# Patient Record
Sex: Female | Born: 1970 | Race: White | Hispanic: No | Marital: Single | Smoking: Never smoker
Health system: Southern US, Community
[De-identification: ages and names within clinical notes are randomized; demographics above are authoritative.]

## PROBLEM LIST (undated history)

## (undated) DIAGNOSIS — K567 Ileus, unspecified: Secondary | ICD-10-CM

## (undated) DIAGNOSIS — K5792 Diverticulitis of intestine, part unspecified, without perforation or abscess without bleeding: Secondary | ICD-10-CM

## (undated) HISTORY — PX: ABDOMINAL HYSTERECTOMY: SHX81

---

## 2004-10-18 ENCOUNTER — Ambulatory Visit: Payer: Self-pay

## 2004-10-28 ENCOUNTER — Ambulatory Visit: Payer: Self-pay

## 2005-09-15 ENCOUNTER — Ambulatory Visit: Payer: Self-pay | Admitting: Internal Medicine

## 2006-11-02 ENCOUNTER — Ambulatory Visit: Payer: Self-pay | Admitting: Family Medicine

## 2008-05-13 ENCOUNTER — Ambulatory Visit: Payer: Self-pay | Admitting: Physician Assistant

## 2008-05-14 ENCOUNTER — Emergency Department: Payer: Self-pay | Admitting: Emergency Medicine

## 2008-06-30 ENCOUNTER — Ambulatory Visit: Payer: Self-pay | Admitting: Internal Medicine

## 2008-07-02 ENCOUNTER — Ambulatory Visit: Payer: Self-pay | Admitting: Internal Medicine

## 2009-03-30 ENCOUNTER — Ambulatory Visit: Payer: Self-pay

## 2009-04-29 ENCOUNTER — Inpatient Hospital Stay: Payer: Self-pay | Admitting: Obstetrics and Gynecology

## 2009-06-14 ENCOUNTER — Ambulatory Visit: Payer: Self-pay | Admitting: Internal Medicine

## 2011-06-07 ENCOUNTER — Ambulatory Visit: Payer: Self-pay | Admitting: Internal Medicine

## 2011-08-30 ENCOUNTER — Ambulatory Visit: Payer: Self-pay | Admitting: Internal Medicine

## 2015-08-30 ENCOUNTER — Other Ambulatory Visit: Payer: Self-pay | Admitting: Internal Medicine

## 2015-08-30 DIAGNOSIS — N644 Mastodynia: Secondary | ICD-10-CM

## 2015-09-10 ENCOUNTER — Other Ambulatory Visit: Payer: Self-pay

## 2015-09-10 ENCOUNTER — Ambulatory Visit: Payer: Self-pay | Attending: Internal Medicine

## 2015-10-26 ENCOUNTER — Ambulatory Visit
Admission: RE | Admit: 2015-10-26 | Discharge: 2015-10-26 | Disposition: A | Discharge status: Home / Self Care | Payer: BC Managed Care – PPO | Source: Ambulatory Visit | Attending: Internal Medicine | Admitting: Internal Medicine

## 2015-10-26 DIAGNOSIS — N644 Mastodynia: Secondary | ICD-10-CM

## 2016-01-09 ENCOUNTER — Encounter: Payer: Self-pay | Admitting: *Deleted

## 2016-01-09 ENCOUNTER — Emergency Department
Admission: EM | Admit: 2016-01-09 | Discharge: 2016-01-09 | Disposition: A | Discharge status: Home / Self Care | Payer: BC Managed Care – PPO | Attending: Emergency Medicine | Admitting: Emergency Medicine

## 2016-01-09 ENCOUNTER — Emergency Department: Payer: BC Managed Care – PPO

## 2016-01-09 DIAGNOSIS — R1032 Left lower quadrant pain: Secondary | ICD-10-CM | POA: Diagnosis present

## 2016-01-09 DIAGNOSIS — Z90710 Acquired absence of both cervix and uterus: Secondary | ICD-10-CM | POA: Diagnosis not present

## 2016-01-09 DIAGNOSIS — K567 Ileus, unspecified: Secondary | ICD-10-CM | POA: Diagnosis not present

## 2016-01-09 DIAGNOSIS — K5732 Diverticulitis of large intestine without perforation or abscess without bleeding: Secondary | ICD-10-CM | POA: Insufficient documentation

## 2016-01-09 HISTORY — DX: Ileus, unspecified: K56.7

## 2016-01-09 HISTORY — DX: Diverticulitis of intestine, part unspecified, without perforation or abscess without bleeding: K57.92

## 2016-01-09 LAB — COMPREHENSIVE METABOLIC PANEL
ALBUMIN: 4.3 g/dL (ref 3.5–5.0)
ALT: 18 U/L (ref 14–54)
AST: 19 U/L (ref 15–41)
Alkaline Phosphatase: 55 U/L (ref 38–126)
Anion gap: 6 (ref 5–15)
BUN: 11 mg/dL (ref 6–20)
CHLORIDE: 102 mmol/L (ref 101–111)
CO2: 26 mmol/L (ref 22–32)
CREATININE: 0.67 mg/dL (ref 0.44–1.00)
Calcium: 9 mg/dL (ref 8.9–10.3)
GFR calc Af Amer: >60 mL/min (ref >60)
Glucose, Bld: 94 mg/dL (ref 65–99)
POTASSIUM: 3.7 mmol/L (ref 3.5–5.1)
SODIUM: 134 mmol/L — AB (ref 135–145)
Total Bilirubin: 1 mg/dL (ref 0.3–1.2)
Total Protein: 7.6 g/dL (ref 6.5–8.1)

## 2016-01-09 LAB — CBC
HEMATOCRIT: 38.8 % (ref 35.0–47.0)
Hemoglobin: 13 g/dL (ref 12.0–16.0)
MCH: 27.2 pg (ref 26.0–34.0)
MCHC: 33.6 g/dL (ref 32.0–36.0)
MCV: 80.9 fL (ref 80.0–100.0)
PLATELETS: 252 10*3/uL (ref 150–440)
RBC: 4.8 MIL/uL (ref 3.80–5.20)
RDW: 13.6 % (ref 11.5–14.5)
WBC: 11.2 10*3/uL — AB (ref 3.6–11.0)

## 2016-01-09 LAB — URINALYSIS COMPLETE WITH MICROSCOPIC (ARMC ONLY)
BACTERIA UA: NONE SEEN
Bilirubin Urine: NEGATIVE
Glucose, UA: NEGATIVE mg/dL
LEUKOCYTES UA: NEGATIVE
Nitrite: NEGATIVE
PH: 5 (ref 5.0–8.0)
Protein, ur: NEGATIVE mg/dL
Specific Gravity, Urine: 1.004 — ABNORMAL LOW (ref 1.005–1.030)
WBC, UA: NONE SEEN WBC/hpf (ref 0–5)

## 2016-01-09 LAB — LIPASE, BLOOD: LIPASE: 17 U/L (ref 11–51)

## 2016-01-09 MED ORDER — METRONIDAZOLE 500 MG PO TABS
500.0000 mg | ORAL_TABLET | Freq: Two times a day (BID) | ORAL | Status: AC
Start: 2016-01-09 — End: 2016-01-30

## 2016-01-09 MED ORDER — IOHEXOL 240 MG/ML SOLN
25.0000 mL | Freq: Once | INTRAMUSCULAR | Status: AC | PRN
  Administered 2016-01-09: 25 mL via ORAL
  Filled 2016-01-09: qty 25

## 2016-01-09 MED ORDER — CIPROFLOXACIN HCL 500 MG PO TABS: 500.0000 mg | ORAL_TABLET | Freq: Two times a day (BID) | ORAL | Status: AC

## 2016-01-09 MED ORDER — IOHEXOL 300 MG/ML  SOLN
100.0000 mL | Freq: Once | INTRAMUSCULAR | Status: DC | PRN
  Filled 2016-01-09: qty 100

## 2016-01-09 NOTE — ED Notes (Signed)
Pt finished with oral contrast, CT tech notified.

## 2016-01-09 NOTE — Discharge Instructions (Signed)
Diverticulitis Diverticulitis is when small pockets that have formed in your colon (large intestine) become infected or swollen. HOME CARE  Follow your doctor's instructions.  Follow a special diet if told by your doctor.  When you feel better, your doctor may tell you to change your diet. You may be told to eat a lot of fiber. Fruits and vegetables are good sources of fiber. Fiber makes it easier to poop (have bowel movements).  Take supplements or probiotics as told by your doctor.  Only take medicines as told by your doctor.  Keep all follow-up visits with your doctor. GET HELP IF:  Your pain does not get better.  You have a hard time eating food.  You are not pooping like normal. GET HELP RIGHT AWAY IF:  Your pain gets worse.  Your problems do not get better.  Your problems suddenly get worse.  You have a fever.  You keep throwing up (vomiting).  You have bloody or black, tarry poop (stool). MAKE SURE YOU:   Understand these instructions.  Will watch your condition.  Will get help right away if you are not doing well or get worse.   This information is not intended to replace advice given to you by your health care provider. Make sure you discuss any questions you have with your health care provider.   Document Released: 03/27/2008 Document Revised: 10/14/2013 Document Reviewed: 09/03/2013 Elsevier Interactive Patient Education [employer].  Please return immediately if condition worsens. Please contact her primary physician or the physician you were given for referral. If you have any specialist physicians involved in her treatment and plan please also contact them. Thank you for using [facility].

## 2016-01-09 NOTE — ED Notes (Addendum)
Patient c/o LLQ pain that radiates to the back. Patient denies vomiting or diarrhea. Patient has history of ileus and diverticulitis, but denies this seems like the same pain.

## 2016-01-09 NOTE — ED Notes (Signed)
Pt returned from Ct.

## 2016-01-09 NOTE — ED Provider Notes (Signed)
Time Seen: Approximately *1410 I have reviewed the triage notes  Chief Complaint: Abdominal Pain   History of Present Illness: Tanya Jimenez is a 45 y.o. female who is noticed a gradual onset of left lower quadrant abdominal pain that radiates to the left lower back region toward the left groin area. She's had a previous history of a partial hysterectomy. She's also had a previous history of diverticulitis. She did denies any dysuria, hematuria or urinary frequency. She states some mild bowel urgency with some loose stool. She denies any melena, hematochezia, fever, chills.   Past Medical History  Diagnosis Date  . Diverticulitis   . Ileus (Minor)     There are no active problems to display for this patient.   Past Surgical History  Procedure Laterality Date  . Abdominal hysterectomy      Past Surgical History  Procedure Laterality Date  . Abdominal hysterectomy      No current outpatient prescriptions on file.  Allergies:  Review of patient's allergies indicates no known allergies.  Family History: Family History  Problem Relation Age of Onset  . Uterine cancer Mother 58  . Breast cancer Paternal Aunt 39  . Stomach cancer Maternal Grandfather     Social History: Social History  Substance Use Topics  . Smoking status: Never Smoker   . Smokeless tobacco: None  . Alcohol Use: Yes     Comment: occasionally     Review of Systems:   10 point review of systems was performed and was otherwise negative:  Constitutional: No fever Eyes: No visual disturbances ENT: No sore throat, ear pain Cardiac: No chest pain Respiratory: No shortness of breath, wheezing, or stridor Abdomen: Left lower quadrant abdominal pain, no vomiting, No diarrhea Endocrine: No weight loss, No night sweats Extremities: No peripheral edema, cyanosis Skin: No rashes, easy bruising Neurologic: No focal weakness, trouble with speech or swollowing Urologic: No dysuria, Hematuria, or urinary  frequency   Physical Exam:  ED Triage Vitals  Enc Vitals Group     BP 01/09/16 1350 109/62 mmHg     Pulse Rate 01/09/16 1350 71     Resp 01/09/16 1350 18     Temp 01/09/16 1350 98.5 F (36.9 C)     Temp Source 01/09/16 1350 Oral     SpO2 01/09/16 1350 97 %     Weight 01/09/16 1350 126 lb (57.153 kg)     Height 01/09/16 1350 5\' 4"  (1.626 m)     Head Cir --      Peak Flow --      Pain Score 01/09/16 1351 7     Pain Loc --      Pain Edu? --      Excl. in [place]? --     General: Awake , Alert , and Oriented times 3; GCS 15 Head: Normal cephalic , atraumatic Eyes: Pupils equal , round, reactive to light Nose/Throat: No nasal drainage, patent upper airway without erythema or exudate.  Neck: Supple, Full range of motion, No anterior adenopathy or palpable thyroid masses Lungs: Clear to ascultation without wheezes , rhonchi, or rales Heart: Regular rate, regular rhythm without murmurs , gallops , or rubs Abdomen: Patient has tenderness toward the left lower quadrant without rebound, guarding , or rigidity; bowel sounds positive and symmetric in all 4 quadrants. No organomegaly .    Negative tenderness over McBurney's point, negative Murphy's sign  Extremities: 2 plus symmetric pulses. No edema, clubbing or cyanosis Neurologic: normal ambulation, Motor  symmetric without deficits, sensory intact Skin: warm, dry, no rashes   Labs:   All laboratory work was reviewed including any pertinent negatives or positives listed below:  Labs Reviewed  COMPREHENSIVE METABOLIC PANEL - Abnormal; Notable for the following:    Sodium 134 (*)    All other components within normal limits  CBC - Abnormal; Notable for the following:    WBC 11.2 (*)    All other components within normal limits  URINALYSIS COMPLETEWITH MICROSCOPIC ([facility] ONLY) - Abnormal; Notable for the following:    Color, Urine STRAW (*)    APPearance CLEAR (*)    Ketones, ur TRACE (*)    Specific Gravity, Urine 1.004 (*)    Hgb  urine dipstick 2+ (*)    Squamous Epithelial / LPF 0-5 (*)    All other components within normal limits  LIPASE, BLOOD   laboratory work shows a slightly elevated white blood cell count   Radiology:   EXAM: CT ABDOMEN AND PELVIS WITH CONTRAST  TECHNIQUE: Multidetector CT imaging of the abdomen and pelvis was performed using the standard protocol following bolus administration of intravenous contrast.  CONTRAST: 100 cc Omnipaque 300  COMPARISON: 06/14/2009  FINDINGS: Lower chest: Unremarkable  Hepatobiliary: Unremarkable  Pancreas: High likelihood of pancreas divisum.  Spleen: Unremarkable  Adrenals/Urinary Tract: Unremarkable  Stomach/Bowel: There is active diverticulitis at the junction of the descending and proximal sigmoid colon, images 52 through 61 series 2, with abnormal stranding in the mesentery adjacent to the inflamed diverticula. No abscess or extraluminal gas identified. The rectosigmoid junction is somewhat flattened leading to the unusual appearance on images 49 through 55 of series 2, but upon multiplanar reconstruction this bowel is thought to be normal.  Appendix normal.  Vascular/Lymphatic: Unremarkable  Reproductive: Uterus absent. Ovaries within normal limits for age.  Other: No supplemental non-categorized findings.  Musculoskeletal: Unremarkable  IMPRESSION: 1. Mild-to-moderate acute diverticulitis at the junction of the descending and proximal sigmoid colon. No abscess or extraluminal gas. 2. 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     I personally reviewed the radiologic studies     ED Course: * Differential diagnosis includes but is not exclusive to ovarian cyst, ovarian torsion, acute appendicitis, urinary tract infection, endometriosis, bowel obstruction, colitis, renal colic, gastroenteritis, etc. Given the patient's current clinical presentation and objective findings I felt most likely this was non-complicated  diverticulitis    Assessment: Diverticulitis     Plan: * Outpatient Patient was advised to return immediately if condition worsens. Patient was advised to follow up with their primary care physician or other specialized physicians involved in their outpatient care. The patient and/or family member/power of attorney had laboratory results reviewed at the bedside. All questions and concerns were addressed and appropriate discharge instructions were distributed by the nursing staff. Patient will be given prescriptions for Cipro and Flagyl           Daymon Larsen, MD 01/09/16 [phone]

## 2016-01-09 NOTE — ED Notes (Signed)
Patient transported to CT 

## 2016-01-09 NOTE — ED Notes (Signed)
Discussed discharge instructions, prescriptions, and follow-up care with patient. No questions or concerns at this time. Pt stable at discharge.  

## 2016-11-20 ENCOUNTER — Other Ambulatory Visit: Payer: Self-pay | Admitting: Internal Medicine

## 2016-11-20 DIAGNOSIS — Z1231 Encounter for screening mammogram for malignant neoplasm of breast: Secondary | ICD-10-CM

## 2016-12-26 ENCOUNTER — Ambulatory Visit
Admission: RE | Admit: 2016-12-26 | Discharge: 2016-12-26 | Disposition: A | Discharge status: Home / Self Care | Payer: BC Managed Care – PPO | Source: Ambulatory Visit | Attending: Internal Medicine | Admitting: Internal Medicine

## 2016-12-26 DIAGNOSIS — Z1231 Encounter for screening mammogram for malignant neoplasm of breast: Secondary | ICD-10-CM | POA: Diagnosis not present

## 2017-08-02 ENCOUNTER — Other Ambulatory Visit: Payer: Self-pay | Admitting: Physician Assistant

## 2017-08-02 DIAGNOSIS — N644 Mastodynia: Secondary | ICD-10-CM

## 2017-08-03 ENCOUNTER — Other Ambulatory Visit: Payer: Self-pay | Admitting: Physician Assistant

## 2017-08-03 DIAGNOSIS — N644 Mastodynia: Secondary | ICD-10-CM

## 2018-04-10 ENCOUNTER — Other Ambulatory Visit: Payer: Self-pay | Admitting: Internal Medicine

## 2018-04-10 DIAGNOSIS — Z1231 Encounter for screening mammogram for malignant neoplasm of breast: Secondary | ICD-10-CM

## 2018-04-15 ENCOUNTER — Other Ambulatory Visit: Payer: Self-pay | Admitting: Internal Medicine

## 2018-04-15 DIAGNOSIS — R140 Abdominal distension (gaseous): Secondary | ICD-10-CM

## 2018-04-22 ENCOUNTER — Ambulatory Visit
Admission: RE | Admit: 2018-04-22 | Discharge: 2018-04-22 | Disposition: A | Discharge status: Home / Self Care | Payer: BC Managed Care – PPO | Source: Ambulatory Visit | Attending: Internal Medicine | Admitting: Internal Medicine

## 2018-04-22 DIAGNOSIS — R140 Abdominal distension (gaseous): Secondary | ICD-10-CM | POA: Insufficient documentation

## 2018-04-22 MED ORDER — IOPAMIDOL (ISOVUE-300) INJECTION 61%
85.0000 mL | Freq: Once | INTRAVENOUS | Status: AC | PRN
  Administered 2018-04-22: 85 mL via INTRAVENOUS

## 2018-05-02 ENCOUNTER — Ambulatory Visit
Admission: RE | Admit: 2018-05-02 | Discharge: 2018-05-02 | Disposition: A | Discharge status: Home / Self Care | Payer: BC Managed Care – PPO | Source: Ambulatory Visit | Attending: Internal Medicine | Admitting: Internal Medicine

## 2018-05-02 DIAGNOSIS — Z1231 Encounter for screening mammogram for malignant neoplasm of breast: Secondary | ICD-10-CM | POA: Insufficient documentation

## 2018-05-06 ENCOUNTER — Other Ambulatory Visit: Payer: Self-pay | Admitting: Internal Medicine

## 2018-05-06 DIAGNOSIS — N6489 Other specified disorders of breast: Secondary | ICD-10-CM

## 2018-05-06 DIAGNOSIS — R928 Other abnormal and inconclusive findings on diagnostic imaging of breast: Secondary | ICD-10-CM

## 2018-05-08 ENCOUNTER — Ambulatory Visit
Admission: RE | Admit: 2018-05-08 | Discharge: 2018-05-08 | Disposition: A | Discharge status: Home / Self Care | Payer: BC Managed Care – PPO | Source: Ambulatory Visit | Attending: Internal Medicine | Admitting: Internal Medicine

## 2018-05-08 DIAGNOSIS — R928 Other abnormal and inconclusive findings on diagnostic imaging of breast: Secondary | ICD-10-CM

## 2018-05-08 DIAGNOSIS — N6489 Other specified disorders of breast: Secondary | ICD-10-CM | POA: Insufficient documentation

## 2019-06-05 ENCOUNTER — Other Ambulatory Visit: Payer: Self-pay

## 2019-06-05 DIAGNOSIS — Z20822 Contact with and (suspected) exposure to covid-19: Secondary | ICD-10-CM

## 2019-06-07 LAB — SPECIMEN STATUS REPORT

## 2019-06-07 LAB — NOVEL CORONAVIRUS, NAA: SARS-CoV-2, NAA: NOT DETECTED

## 2019-07-09 DIAGNOSIS — F902 Attention-deficit hyperactivity disorder, combined type: Secondary | ICD-10-CM | POA: Insufficient documentation

## 2019-12-20 ENCOUNTER — Ambulatory Visit: Payer: BC Managed Care – PPO | Attending: Internal Medicine

## 2019-12-20 DIAGNOSIS — Z23 Encounter for immunization: Secondary | ICD-10-CM | POA: Insufficient documentation

## 2019-12-20 NOTE — Progress Notes (Signed)
   Covid-19 Vaccination Clinic  Name:  PITA MACFADYEN    MRN: [id number] DOB: 24-Mar-1971  12/20/2019  Ms. Joshua was observed post Covid-19 immunization for 15 minutes without incidence. She was provided with Vaccine Information Sheet and instruction to access the V-Safe system.   Ms. Criollo was instructed to call 911 with any severe reactions post vaccine: Marland Kitchen Difficulty breathing  . Swelling of your face and throat  . A fast heartbeat  . A bad rash all over your body  . Dizziness and weakness    Immunizations Administered    Name Date Dose VIS Date Route   Moderna COVID-19 Vaccine 12/20/2019  3:47 PM 0.5 mL 09/23/2019 Intramuscular   Manufacturer: Moderna   Lot: [id number]   [place]:[id number]

## 2020-01-17 ENCOUNTER — Ambulatory Visit: Payer: BC Managed Care – PPO | Attending: Internal Medicine

## 2020-01-17 DIAGNOSIS — Z23 Encounter for immunization: Secondary | ICD-10-CM

## 2020-01-17 NOTE — Progress Notes (Signed)
   Covid-19 Vaccination Clinic  Name:  Tanya Jimenez    MRN: [id number] DOB: Dec 07, 1970  01/17/2020  [name] was observed post Covid-19 immunization for 15 minutes without incident. She was provided with Vaccine Information Sheet and instruction to access the V-Safe system.   [name] was instructed to call 911 with any severe reactions post vaccine: Marland Kitchen Difficulty breathing  . Swelling of face and throat  . A fast heartbeat  . A bad rash all over body  . Dizziness and weakness   Immunizations Administered    Name Date Dose VIS Date Route   Moderna COVID-19 Vaccine 01/17/2020 12:49 PM 0.5 mL 09/23/2019 Intramuscular   Manufacturer: Levan Hurst   LotFY:[id number]   [place]:[id number]

## 2020-02-09 IMAGING — CT CT ABD-PELV W/ CM
2 of 5 series · 16 of 46 positions shown, 18 images · IV contrast (iopamidol)
Comparison: 01/09/2016

CLINICAL DATA: Abdominal distension, weight gain of 10-15 pounds in
4-5 months, constipation, history of diverticulitis, hysterectomy
due to fibroids

EXAM:
CT ABDOMEN AND PELVIS WITH CONTRAST
TECHNIQUE: Multidetector CT imaging of the abdomen and pelvis was performed
using the standard protocol following bolus administration of
intravenous contrast. Sagittal and coronal MPR images reconstructed
from axial data set.
CONTRAST:  85mL [id number]-KVV IOPAMIDOL ([id number]) INJECTION 61% IV.
Dilute oral contrast.

[Series 2: abd pelvis · axial · 0.70mm/px · z∈[-1728,-1303]mm · 13 of 95 slices shown, 15 images (1 of 2)]
[im 5/95  soft-tissue]
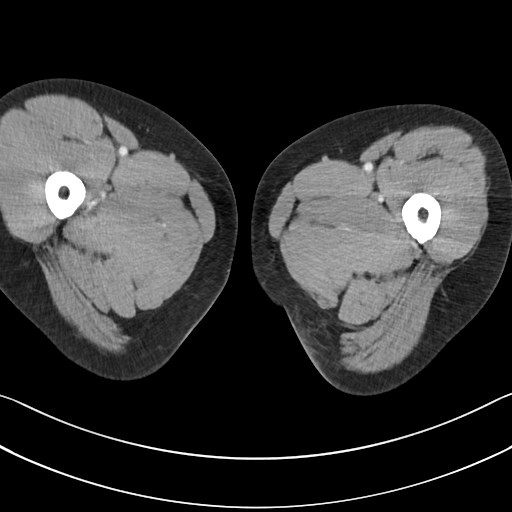
[im 5/95  bone]
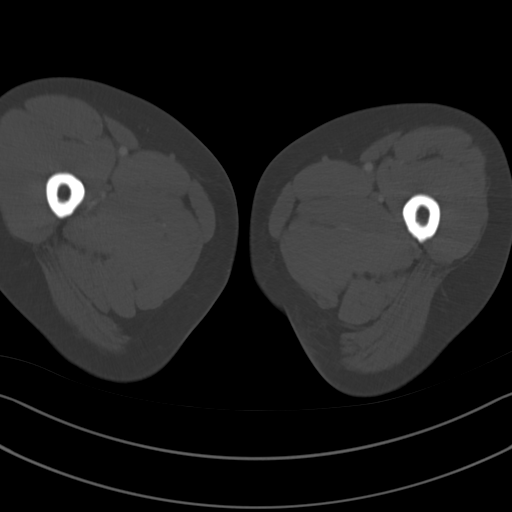
[im 15/95  soft-tissue]
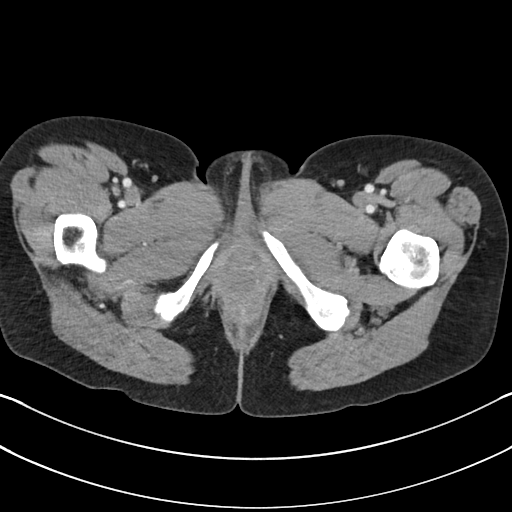
[im 19/95  soft-tissue]
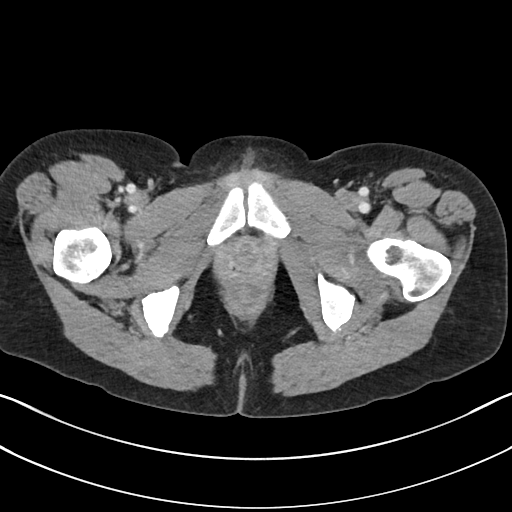
[im 29/95  soft-tissue]
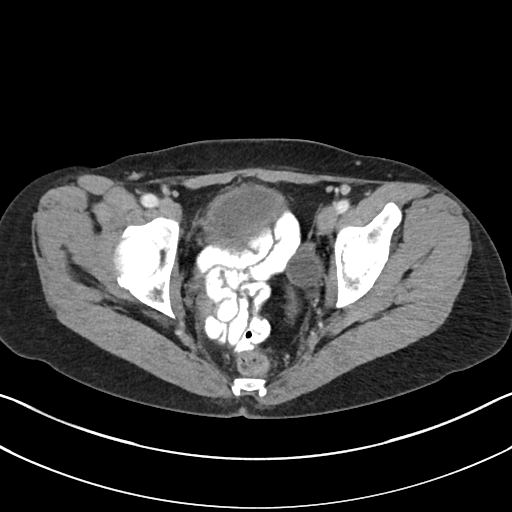
[im 33/95  soft-tissue]
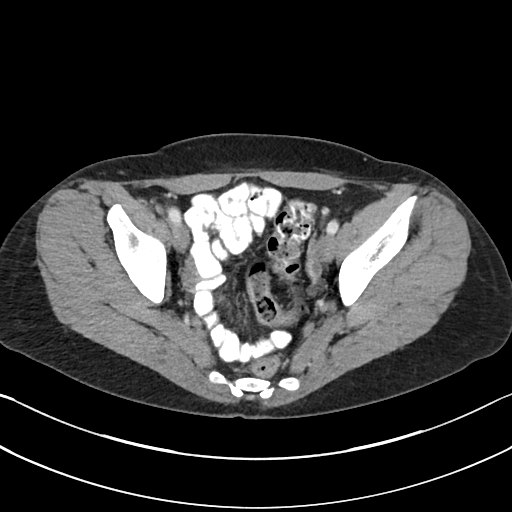
[im 43/95  soft-tissue]
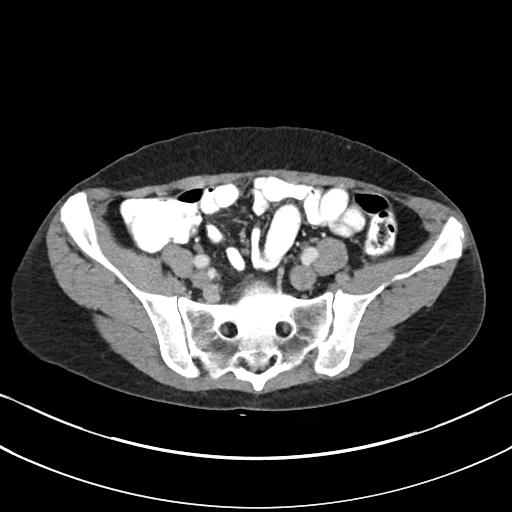
[im 48/95  soft-tissue]
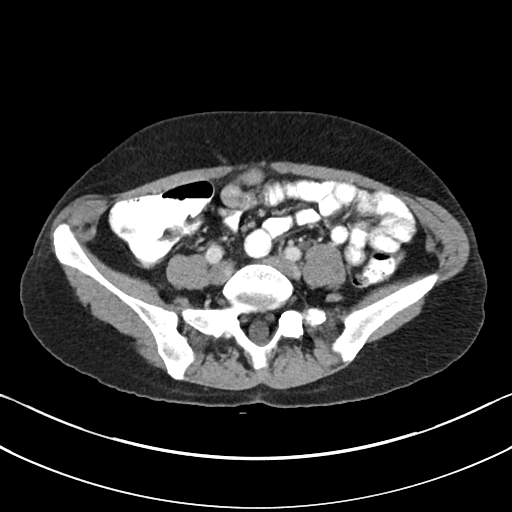
[im 52/95  soft-tissue]
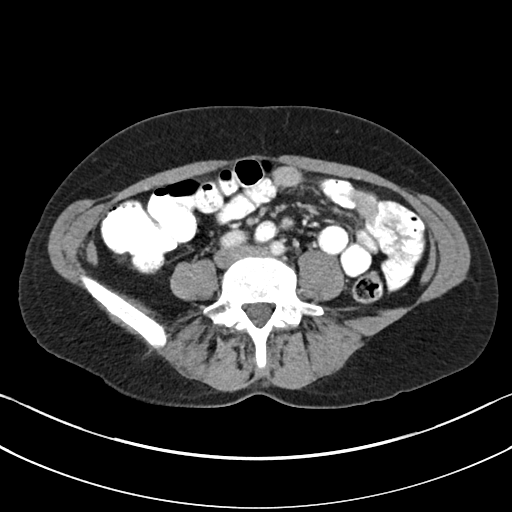
[im 62/95  soft-tissue]
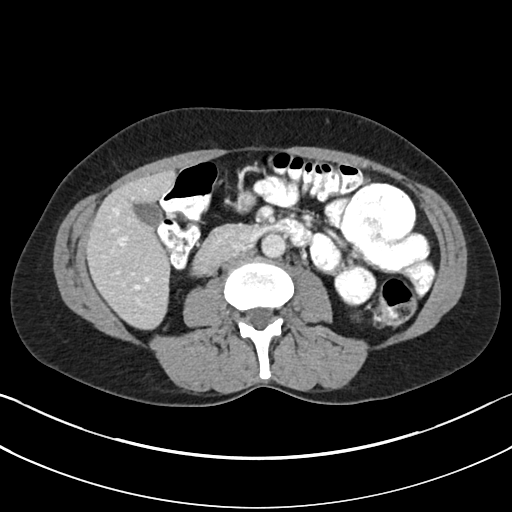
[im 62/95  bone]
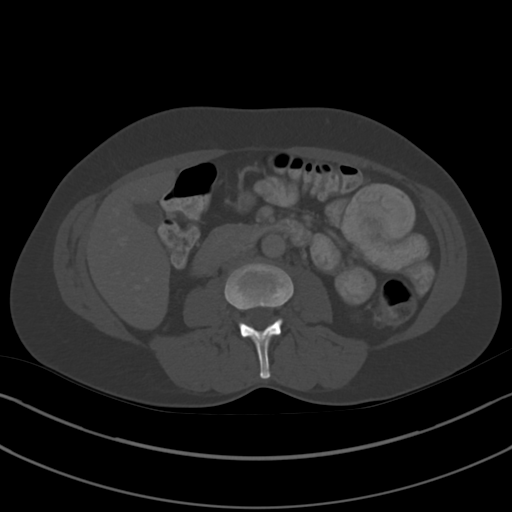
[im 66/95  soft-tissue]
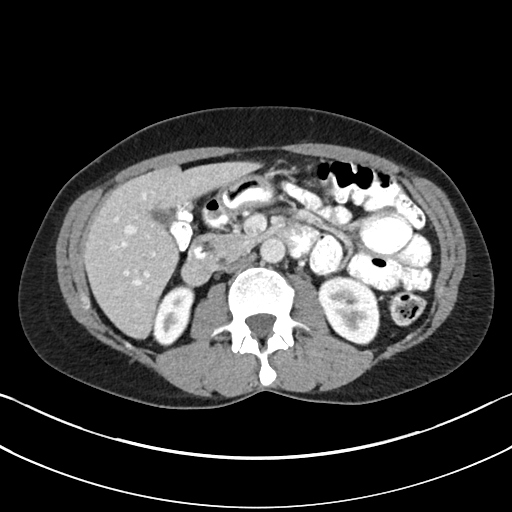
[im 76/95  soft-tissue]
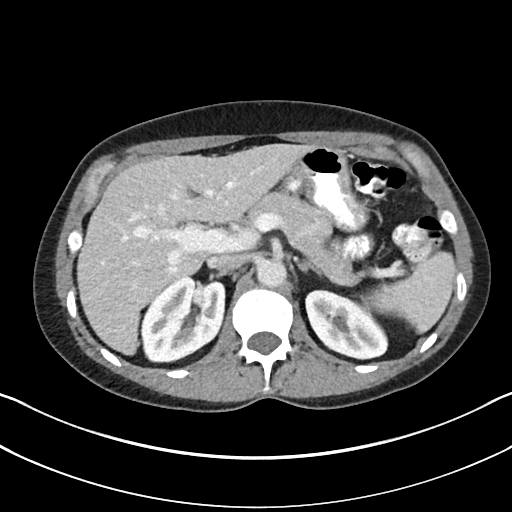
[im 80/95  soft-tissue]
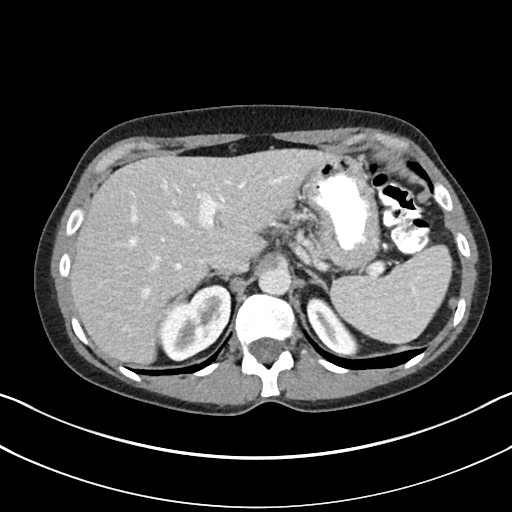
[im 90/95  soft-tissue]
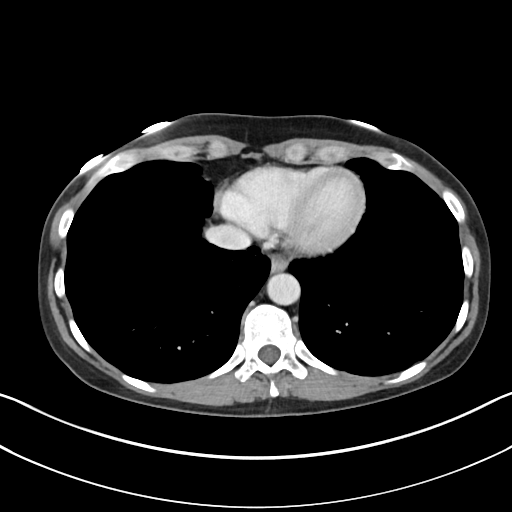

[Series 4: abd pelvis · coronal · 0.70mm/px · 3 of 126 slices shown (2 of 2)]
[im 42/126  soft-tissue]
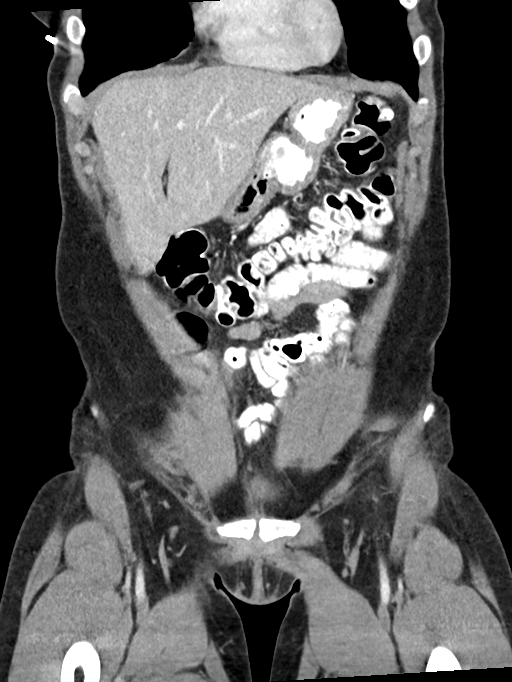
[im 56/126  soft-tissue]
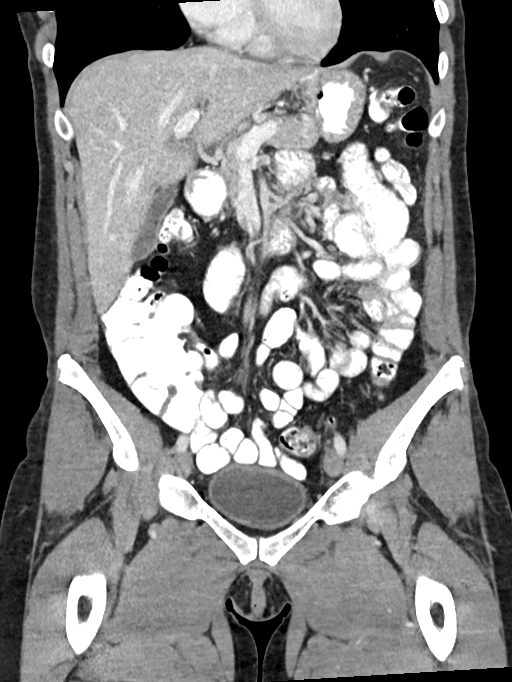
[im 70/126  soft-tissue]
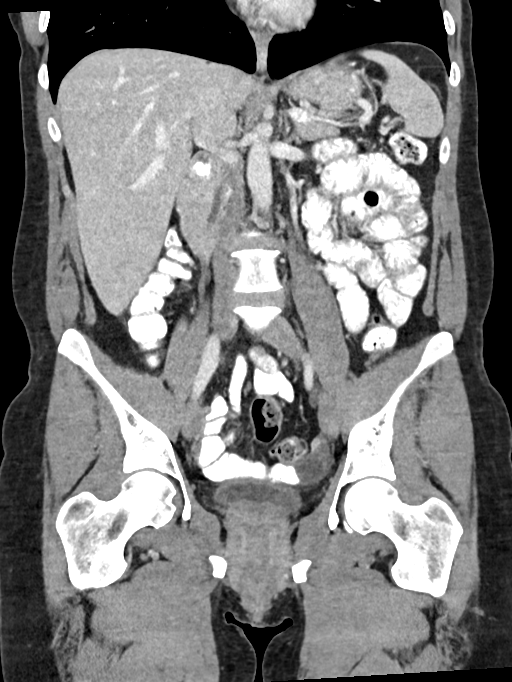

[16 of 46 positions shown; findings below may reference images not displayed]

FINDINGS: Lower chest: Lung bases clear

Hepatobiliary: Gallbladder and liver normal appearance

Pancreas: Normal appearance

Spleen: Normal appearance.  Small splenule inferior to spleen

Adrenals/Urinary Tract: Adrenal glands, kidneys, ureters, and
bladder normal appearance

Stomach/Bowel: Normal appendix. Stomach and bowel loops normal
appearance.

Vascular/Lymphatic: Vascular structures unremarkable. No adenopathy.

Reproductive: Uterus surgically absent. Dominant follicle LEFT ovary
2.4 cm diameter. Ovaries otherwise unremarkable.

Other: No free air or free fluid. No hernia or acute inflammatory
process.

Musculoskeletal: Osseous structures unremarkable.
IMPRESSION: No significant intra-abdominal or intrapelvic abnormalities.

## 2020-02-19 ENCOUNTER — Other Ambulatory Visit: Payer: Self-pay

## 2020-02-19 ENCOUNTER — Telehealth (INDEPENDENT_AMBULATORY_CARE_PROVIDER_SITE_OTHER): Payer: Self-pay | Admitting: Gastroenterology

## 2020-02-19 DIAGNOSIS — Z1211 Encounter for screening for malignant neoplasm of colon: Secondary | ICD-10-CM

## 2020-02-19 DIAGNOSIS — T7840XA Allergy, unspecified, initial encounter: Secondary | ICD-10-CM | POA: Insufficient documentation

## 2020-02-19 NOTE — Progress Notes (Signed)
Gastroenterology Pre-Procedure Review  Request Date: Wed 03/31/20 Requesting Physician: Dr. Marius Ditch  PATIENT REVIEW QUESTIONS: The patient responded to the following health history questions as indicated:    1. Are you having any GI issues? no 2. Do you have a personal history of Polyps? no 3. Do you have a family history of Colon Cancer or Polyps? yes (sister colon polyps) 4. Diabetes Mellitus? no 5. Joint replacements in the past 12 months?no 6. Major health problems in the past 3 months?no 7. Any artificial heart valves, MVP, or defibrillator?no    MEDICATIONS & ALLERGIES:    Patient reports the following regarding taking any anticoagulation/antiplatelet therapy:   Plavix, Coumadin, Eliquis, Xarelto, Lovenox, Pradaxa, Brilinta, or Effient? no Aspirin? no  Patient confirms/reports the following medications:  Current Outpatient Medications  Medication Sig Dispense Refill  . [START ON 03/02/2020] amphetamine-dextroamphetamine (ADDERALL) 10 MG tablet Take by mouth.    . cefdinir (OMNICEF) 300 MG capsule Take 300 mg by mouth every 12 (twelve) hours.     No current facility-administered medications for this visit.    Patient confirms/reports the following allergies:  No Known Allergies  No orders of the defined types were placed in this encounter.   AUTHORIZATION INFORMATION Primary Insurance: 1D#: Group #:  Secondary Insurance: 1D#: Group #:  SCHEDULE INFORMATION: Date: Wed 03/31/20 Time: Location:[facility]

## 2020-03-29 ENCOUNTER — Other Ambulatory Visit
Admission: RE | Admit: 2020-03-29 | Discharge: 2020-03-29 | Disposition: A | Discharge status: Home / Self Care | Payer: BC Managed Care – PPO | Source: Ambulatory Visit | Attending: Gastroenterology | Admitting: Gastroenterology

## 2020-03-29 ENCOUNTER — Other Ambulatory Visit: Payer: Self-pay

## 2020-03-29 DIAGNOSIS — Z20822 Contact with and (suspected) exposure to covid-19: Secondary | ICD-10-CM | POA: Diagnosis not present

## 2020-03-29 DIAGNOSIS — Z01812 Encounter for preprocedural laboratory examination: Secondary | ICD-10-CM | POA: Diagnosis not present

## 2020-03-29 LAB — SARS CORONAVIRUS 2 (TAT 6-24 HRS): SARS Coronavirus 2: NEGATIVE

## 2020-03-30 ENCOUNTER — Encounter: Payer: Self-pay | Admitting: Gastroenterology

## 2020-03-31 ENCOUNTER — Ambulatory Visit: Payer: BC Managed Care – PPO | Admitting: Anesthesiology

## 2020-03-31 ENCOUNTER — Ambulatory Visit
Admission: RE | Admit: 2020-03-31 | Discharge: 2020-03-31 | Disposition: A | Discharge status: Home / Self Care | Payer: BC Managed Care – PPO | Attending: Gastroenterology | Admitting: Gastroenterology

## 2020-03-31 ENCOUNTER — Other Ambulatory Visit: Payer: Self-pay | Admitting: Internal Medicine

## 2020-03-31 ENCOUNTER — Encounter
Admission: RE | Disposition: A | Discharge status: Home / Self Care | Payer: Self-pay | Source: Home / Self Care | Attending: Gastroenterology

## 2020-03-31 DIAGNOSIS — K5730 Diverticulosis of large intestine without perforation or abscess without bleeding: Secondary | ICD-10-CM | POA: Diagnosis not present

## 2020-03-31 DIAGNOSIS — Z79899 Other long term (current) drug therapy: Secondary | ICD-10-CM | POA: Diagnosis not present

## 2020-03-31 DIAGNOSIS — D122 Benign neoplasm of ascending colon: Secondary | ICD-10-CM | POA: Diagnosis not present

## 2020-03-31 DIAGNOSIS — K644 Residual hemorrhoidal skin tags: Secondary | ICD-10-CM | POA: Diagnosis not present

## 2020-03-31 DIAGNOSIS — F909 Attention-deficit hyperactivity disorder, unspecified type: Secondary | ICD-10-CM | POA: Diagnosis not present

## 2020-03-31 DIAGNOSIS — Z1231 Encounter for screening mammogram for malignant neoplasm of breast: Secondary | ICD-10-CM

## 2020-03-31 DIAGNOSIS — K635 Polyp of colon: Secondary | ICD-10-CM | POA: Diagnosis not present

## 2020-03-31 DIAGNOSIS — Z1211 Encounter for screening for malignant neoplasm of colon: Secondary | ICD-10-CM

## 2020-03-31 HISTORY — PX: COLONOSCOPY WITH PROPOFOL: SHX5780

## 2020-03-31 SURGERY — COLONOSCOPY WITH PROPOFOL
Anesthesia: General

## 2020-03-31 MED ORDER — PROPOFOL 10 MG/ML IV BOLUS
INTRAVENOUS | Status: DC | PRN
  Administered 2020-03-31: 70 mg via INTRAVENOUS

## 2020-03-31 MED ORDER — LIDOCAINE 2% (20 MG/ML) 5 ML SYRINGE
INTRAMUSCULAR | Status: DC | PRN
  Administered 2020-03-31: 50 mg via INTRAVENOUS

## 2020-03-31 MED ORDER — PROPOFOL 500 MG/50ML IV EMUL
INTRAVENOUS | Status: AC
  Filled 2020-03-31: qty 50

## 2020-03-31 MED ORDER — PROPOFOL 500 MG/50ML IV EMUL
INTRAVENOUS | Status: DC | PRN
  Administered 2020-03-31: 150 ug/kg/min via INTRAVENOUS

## 2020-03-31 MED ORDER — SODIUM CHLORIDE 0.9 % IV SOLN
INTRAVENOUS | Status: DC
  Administered 2020-03-31: 1000 mL via INTRAVENOUS

## 2020-03-31 MED ORDER — SODIUM CHLORIDE 0.9 % IV SOLN: INTRAVENOUS | Status: DC

## 2020-03-31 MED ORDER — LIDOCAINE HCL (PF) 2 % IJ SOLN
INTRAMUSCULAR | Status: AC
  Filled 2020-03-31: qty 10

## 2020-03-31 NOTE — Op Note (Signed)
[facility] Gastroenterology Patient Name: Tanya Jimenez Procedure Date: 03/31/2020 10:22 AM MRN: [id number] Account #: [id number] Date of Birth: [birth year] Admit Type: Outpatient Age: 49 Room: [facility] ENDO ROOM 4 Gender: Female Note Status: Finalized Procedure:             Colonoscopy Indications:           Screening for colorectal malignant neoplasm, This is                         the patient's first colonoscopy Providers:             Lin Landsman MD, MD Medicines:             Monitored Anesthesia Care Complications:         No immediate complications. Estimated blood loss: None. Procedure:             Pre-Anesthesia Assessment:                        - Prior to the procedure, a History and Physical was                         performed, and patient medications and allergies were                         reviewed. The patient is competent. The risks and                         benefits of the procedure and the sedation options and                         risks were discussed with the patient. All questions                         were answered and informed consent was obtained.                         Patient identification and proposed procedure were                         verified by the physician, the nurse, the                         anesthesiologist, the anesthetist and the technician                         in the pre-procedure area in the procedure room in the                         endoscopy suite. Mental Status Examination: alert and                         oriented. Airway Examination: normal oropharyngeal                         airway and neck mobility. Respiratory Examination:                         clear to auscultation. CV Examination: normal.  Prophylactic Antibiotics: The patient does not require                         prophylactic antibiotics. Prior Anticoagulants: The                         patient has taken no  previous anticoagulant or                         antiplatelet agents. ASA Grade Assessment: II - A                         patient with mild systemic disease. After reviewing                         the risks and benefits, the patient was deemed in                         satisfactory condition to undergo the procedure. The                         anesthesia plan was to use monitored anesthesia care                         (MAC). Immediately prior to administration of                         medications, the patient was re-assessed for adequacy                         to receive sedatives. The heart rate, respiratory                         rate, oxygen saturations, blood pressure, adequacy of                         pulmonary ventilation, and response to care were                         monitored throughout the procedure. The physical                         status of the patient was re-assessed after the                         procedure.                        After obtaining informed consent, the colonoscope was                         passed under direct vision. Throughout the procedure,                         the patient's blood pressure, pulse, and oxygen                         saturations were monitored continuously. The  Colonoscope was introduced through the anus and                         advanced to the the cecum, identified by appendiceal                         orifice and ileocecal valve. The colonoscopy was                         performed without difficulty. The patient tolerated                         the procedure well. The quality of the bowel                         preparation was evaluated using the BBPS [facility] Bowel                         Preparation Scale) with scores of: Right Colon = 3,                         Transverse Colon = 3 and Left Colon = 3 (entire mucosa                         seen well with no residual staining, small  fragments                         of stool or opaque liquid). The total BBPS score                         equals 9. Findings:      Skin tags were found on perianal exam.      A 8 mm polyp was found in the ascending colon. The polyp was sessile.       The polyp was removed with a cold snare. Resection and retrieval were       complete.      Multiple diverticula were found in the sigmoid colon and descending       colon.      The retroflexed view of the distal rectum and anal verge was normal and       showed no anal or rectal abnormalities. Impression:            - Perianal skin tags found on perianal exam.                        - One 8 mm polyp in the ascending colon, removed with                         a cold snare. Resected and retrieved.                        - Diverticulosis in the sigmoid colon and in the                         descending colon.                        - The  distal rectum and anal verge are normal on                         retroflexion view. Recommendation:        - Discharge patient to home (with escort).                        - Resume previous diet today.                        - Continue present medications.                        - Await pathology results.                        - Repeat colonoscopy in 7 years for surveillance. Procedure Code(s):     --- Professional ---                        [phone], Colonoscopy, flexible; with removal of                         tumor(s), polyp(s), or other lesion(s) by snare                         technique Diagnosis Code(s):     --- Professional ---                        Z12.11, Encounter for screening for malignant neoplasm                         of colon                        K63.5, Polyp of colon                        K64.4, Residual hemorrhoidal skin tags                        K57.30, Diverticulosis of large intestine without                         perforation or abscess without bleeding CPT copyright  2019 American Medical Association. All rights reserved. The codes documented in this report are preliminary and upon coder review may  be revised to meet current compliance requirements. Dr. Ulyess Mort Lin Landsman MD, MD 03/31/2020 10:46:31 AM This report has been signed electronically. Number of Addenda: 0 Note Initiated On: 03/31/2020 10:22 AM Scope Withdrawal Time: 0 hours 9 minutes 0 seconds  Total Procedure Duration: 0 hours 12 minutes 29 seconds  Estimated Blood Loss:  Estimated blood loss: none.      [facility]

## 2020-03-31 NOTE — H&P (Signed)
Tanya Darby, MD [address]  [place]  [place], [place] [address]  Main: [phone]  Fax: [phone] Pager: [phone]  Primary Care Physician:  Idelle Crouch, MD Primary Gastroenterologist:  Dr. Cephas Jimenez  Pre-Procedure History & Physical: HPI:  Tanya Jimenez is a 49 y.o. female is here for an colonoscopy.   Past Medical History:  Diagnosis Date   Diverticulitis    Ileus [facility])     Past Surgical History:  Procedure Laterality Date   ABDOMINAL HYSTERECTOMY      Prior to Admission medications   Medication Sig Start Date End Date Taking? Authorizing Provider  amphetamine-dextroamphetamine (ADDERALL) 10 MG tablet Take by mouth. 03/02/20 04/01/20  [provider]  cefdinir (OMNICEF) 300 MG capsule Take 300 mg by mouth every 12 (twelve) hours. 10/20/19   [provider]    Allergies as of 02/20/2020   (No Known Allergies)    Family History  Problem Relation Age of Onset   Uterine cancer Mother 66   Breast cancer Paternal Aunt 53   Stomach cancer Maternal Grandfather     Social History   Socioeconomic History   Marital status: Single    Spouse name: Not on file   Number of children: Not on file   Years of education: Not on file   Highest education level: Not on file  Occupational History   Not on file  Tobacco Use   Smoking status: Never Smoker  Substance and Sexual Activity   Alcohol use: Yes    Comment: occasionally   Drug use: Not on file   Sexual activity: Not on file  Other Topics Concern   Not on file  Social History Narrative   Not on file   Social Determinants of Health   Financial Resource Strain:    Difficulty of Paying Living Expenses:   Food Insecurity:    Worried About [place] in the Last Year:    Arboriculturist in the Last Year:   Transportation Needs:    Film/video editor (Medical):    Lack of Transportation (Non-Medical):   Physical Activity:     Days of Exercise per Week:    Minutes of Exercise per Session:   Stress:    Feeling of Stress :   Social Connections:    Frequency of Communication with Friends and Family:    Frequency of Social Gatherings with Friends and Family:    Attends Religious Services:    Active Member of Clubs or Organizations:    Attends Music therapist:    Marital Status:   Intimate Partner Violence:    Fear of Current or Ex-Partner:    Emotionally Abused:    Physically Abused:    Sexually Abused:     Review of Systems: See HPI, otherwise negative ROS  Physical Exam: BP 111/72    Pulse 82    Temp (!) 96 F (35.6 C) (Temporal)    Resp 18    Ht 5\' 4"  (1.626 m)    Wt 63.5 kg    SpO2 100%    BMI 24.03 kg/m  General:   Alert,  pleasant and cooperative in NAD Head:  Normocephalic and atraumatic. Neck:  Supple; no masses or thyromegaly. Lungs:  Clear throughout to auscultation.    Heart:  Regular rate and rhythm. Abdomen:  Soft, nontender and nondistended. Normal bowel sounds, without guarding, and without rebound.   Neurologic:  Alert and  oriented x4;  grossly normal neurologically.  Impression/Plan: Tanya Jimenez is here for an colonoscopy to be performed for colon cancer screening  Risks, benefits, limitations, and alternatives regarding  colonoscopy have been reviewed with the patient.  Questions have been answered.  All parties agreeable.   Sherri Sear, MD  03/31/2020, 10:15 AM

## 2020-03-31 NOTE — Anesthesia Postprocedure Evaluation (Signed)
Anesthesia Post Note  Patient: Tanya Jimenez  Procedure(s) Performed: COLONOSCOPY WITH PROPOFOL (N/A )  Patient location during evaluation: Endoscopy Anesthesia Type: General Level of consciousness: awake and alert and oriented Pain management: pain level controlled Vital Signs Assessment: post-procedure vital signs reviewed and stable Respiratory status: spontaneous breathing, nonlabored ventilation and respiratory function stable Cardiovascular status: blood pressure returned to baseline and stable Postop Assessment: no signs of nausea or vomiting Anesthetic complications: no     Last Vitals:  Vitals:   03/31/20 1100 03/31/20 1120  BP: 112/74 111/77  Pulse: 73 64  Resp: 14 (!) 21  Temp:    SpO2: 100% 100%    Last Pain:  Vitals:   03/31/20 1050  TempSrc: Temporal  PainSc:                  Brooks Stotz

## 2020-03-31 NOTE — Anesthesia Preprocedure Evaluation (Signed)
Anesthesia Evaluation  Patient identified by MRN, date of birth, ID band Patient awake    Reviewed: Allergy & Precautions, NPO status , Patient's Chart, lab work & pertinent test results  History of Anesthesia Complications Negative for: history of anesthetic complications  Airway Mallampati: II  TM Distance: >3 FB Neck ROM: Full    Dental no notable dental hx.    Pulmonary neg pulmonary ROS, neg sleep apnea, neg COPD,    breath sounds clear to auscultation- rhonchi (-) wheezing      Cardiovascular Exercise Tolerance: Good (-) hypertension(-) CAD and (-) Past MI  Rhythm:Regular Rate:Normal - Systolic murmurs and - Diastolic murmurs    Neuro/Psych PSYCHIATRIC DISORDERS (ADHD) negative neurological ROS     GI/Hepatic negative GI ROS, Neg liver ROS,   Endo/Other  negative endocrine ROSneg diabetes  Renal/GU negative Renal ROS     Musculoskeletal negative musculoskeletal ROS (+)   Abdominal (+) - obese,   Peds  Hematology negative hematology ROS (+)   Anesthesia Other Findings Past Medical History: No date: Diverticulitis No date: Ileus (HCC)   Reproductive/Obstetrics                             Anesthesia Physical Anesthesia Plan  ASA: II  Anesthesia Plan: General   Post-op Pain Management:    Induction: Intravenous  PONV Risk Score and Plan: 2 and Propofol infusion  Airway Management Planned: Natural Airway  Additional Equipment:   Intra-op Plan:   Post-operative Plan:   Informed Consent: I have reviewed the patients History and Physical, chart, labs and discussed the procedure including the risks, benefits and alternatives for the proposed anesthesia with the patient or authorized representative who has indicated his/her understanding and acceptance.     Dental advisory given  Plan Discussed with: CRNA and Anesthesiologist  Anesthesia Plan Comments:          Anesthesia Quick Evaluation

## 2020-03-31 NOTE — Transfer of Care (Signed)
Immediate Anesthesia Transfer of Care Note  Patient: Tanya Jimenez  Procedure(s) Performed: COLONOSCOPY WITH PROPOFOL (N/A )  Patient Location: Endoscopy Unit  Anesthesia Type:General  Level of Consciousness: awake, alert  and oriented  Airway & Oxygen Therapy: Patient Spontanous Breathing  Post-op Assessment: Post -op Vital signs reviewed and stable  Post vital signs: stable  Last Vitals:  Vitals Value Taken Time  BP 102/64 03/31/20 1050  Temp 35.8 C 03/31/20 1050  Pulse 74 03/31/20 1056  Resp 15 03/31/20 1056  SpO2 100 % 03/31/20 1056  Vitals shown include unvalidated device data.  Last Pain:  Vitals:   03/31/20 1050  TempSrc: Temporal  PainSc:          Complications: No apparent anesthesia complications

## 2020-04-01 ENCOUNTER — Encounter: Payer: Self-pay | Admitting: Gastroenterology

## 2020-04-01 LAB — SURGICAL PATHOLOGY

## 2020-11-11 ENCOUNTER — Other Ambulatory Visit: Payer: Self-pay

## 2020-11-11 ENCOUNTER — Ambulatory Visit
Admission: RE | Admit: 2020-11-11 | Discharge: 2020-11-11 | Disposition: A | Discharge status: Home / Self Care | Payer: BC Managed Care – PPO | Source: Ambulatory Visit | Attending: Internal Medicine | Admitting: Internal Medicine

## 2020-11-11 DIAGNOSIS — Z1231 Encounter for screening mammogram for malignant neoplasm of breast: Secondary | ICD-10-CM | POA: Diagnosis present

## 2022-02-06 ENCOUNTER — Other Ambulatory Visit: Payer: Self-pay | Admitting: Internal Medicine

## 2022-02-06 DIAGNOSIS — Z1231 Encounter for screening mammogram for malignant neoplasm of breast: Secondary | ICD-10-CM
